# Patient Record
Sex: Male | Born: 1995 | Race: Black or African American | Hispanic: No | Marital: Single | Smoking: Never smoker
Health system: Southern US, Community
[De-identification: ages and names within clinical notes are randomized; demographics above are authoritative.]

## PROBLEM LIST (undated history)

## (undated) DIAGNOSIS — J45909 Unspecified asthma, uncomplicated: Secondary | ICD-10-CM

## (undated) HISTORY — PX: HERNIA REPAIR: SHX51

---

## 2017-08-10 ENCOUNTER — Other Ambulatory Visit: Payer: Self-pay

## 2017-08-10 ENCOUNTER — Emergency Department (HOSPITAL_COMMUNITY)
Admission: EM | Admit: 2017-08-10 | Discharge: 2017-08-10 | Disposition: A | Discharge status: Home / Self Care | Payer: BC Managed Care – PPO | Attending: Emergency Medicine | Admitting: Emergency Medicine

## 2017-08-10 ENCOUNTER — Emergency Department (HOSPITAL_COMMUNITY): Payer: BC Managed Care – PPO

## 2017-08-10 ENCOUNTER — Encounter (HOSPITAL_COMMUNITY): Payer: Self-pay

## 2017-08-10 DIAGNOSIS — Y9389 Activity, other specified: Secondary | ICD-10-CM | POA: Insufficient documentation

## 2017-08-10 DIAGNOSIS — S39012A Strain of muscle, fascia and tendon of lower back, initial encounter: Secondary | ICD-10-CM | POA: Diagnosis not present

## 2017-08-10 DIAGNOSIS — Y999 Unspecified external cause status: Secondary | ICD-10-CM | POA: Diagnosis not present

## 2017-08-10 DIAGNOSIS — J45909 Unspecified asthma, uncomplicated: Secondary | ICD-10-CM | POA: Diagnosis not present

## 2017-08-10 DIAGNOSIS — Y92410 Unspecified street and highway as the place of occurrence of the external cause: Secondary | ICD-10-CM | POA: Insufficient documentation

## 2017-08-10 DIAGNOSIS — S3992XA Unspecified injury of lower back, initial encounter: Secondary | ICD-10-CM | POA: Diagnosis present

## 2017-08-10 HISTORY — DX: Unspecified asthma, uncomplicated: J45.909

## 2017-08-10 MED ORDER — METHOCARBAMOL 500 MG PO TABS: 500.0000 mg | ORAL_TABLET | Freq: Two times a day (BID) | ORAL | 0 refills | Status: AC

## 2017-08-10 MED ORDER — IBUPROFEN 800 MG PO TABS
800.0000 mg | ORAL_TABLET | Freq: Once | ORAL | Status: AC
  Administered 2017-08-10: 800 mg via ORAL
  Filled 2017-08-10: qty 1

## 2017-08-10 MED ORDER — IBUPROFEN 600 MG PO TABS: 600.0000 mg | ORAL_TABLET | Freq: Four times a day (QID) | ORAL | 0 refills | Status: AC | PRN

## 2017-08-10 NOTE — ED Notes (Signed)
Pt verbalizes understanding of d/c instructions. Pt received prescriptions. Pt ambulatory at d/c with all belongings.  

## 2017-08-10 NOTE — ED Notes (Signed)
Patient transported to X-ray 
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
## 2017-08-10 NOTE — ED Notes (Signed)
Patient returned from XRay
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
## 2019-08-03 ENCOUNTER — Other Ambulatory Visit: Payer: Self-pay

## 2019-08-03 DIAGNOSIS — Z20822 Contact with and (suspected) exposure to covid-19: Secondary | ICD-10-CM

## 2019-08-04 LAB — NOVEL CORONAVIRUS, NAA: SARS-CoV-2, NAA: NOT DETECTED

## 2019-08-08 ENCOUNTER — Other Ambulatory Visit: Payer: BC Managed Care – PPO

## 2019-08-31 ENCOUNTER — Other Ambulatory Visit: Payer: Self-pay

## 2019-08-31 DIAGNOSIS — Z20822 Contact with and (suspected) exposure to covid-19: Secondary | ICD-10-CM

## 2019-09-01 LAB — NOVEL CORONAVIRUS, NAA: SARS-CoV-2, NAA: NOT DETECTED

## 2019-12-05 ENCOUNTER — Ambulatory Visit: Payer: BC Managed Care – PPO | Attending: Internal Medicine

## 2019-12-05 DIAGNOSIS — Z23 Encounter for immunization: Secondary | ICD-10-CM

## 2019-12-05 NOTE — Progress Notes (Signed)
   Covid-19 Vaccination Clinic  Name:  Nathan Hatfield    MRN: [id number] DOB: 08/12/1996  12/05/2019  [name] was observed post Covid-19 immunization for 15 minutes without incident. He was provided with Vaccine Information Sheet and instruction to access the V-Safe system.   Mr. Able was instructed to call 911 with any severe reactions post vaccine: Marland Kitchen Difficulty breathing  . Swelling of face and throat  . A fast heartbeat  . A bad rash all over body  . Dizziness and weakness   Immunizations Administered    Name Date Dose VIS Date Route   Pfizer COVID-19 Vaccine 12/05/2019  1:36 PM 0.3 mL 08/02/2019 Intramuscular   Manufacturer: [employer], [employer]   Lot: [id number]   NDC: [address]-2

## 2019-12-30 ENCOUNTER — Ambulatory Visit: Payer: BC Managed Care – PPO | Attending: Internal Medicine

## 2019-12-30 DIAGNOSIS — Z23 Encounter for immunization: Secondary | ICD-10-CM

## 2019-12-30 NOTE — Progress Notes (Signed)
   Covid-19 Vaccination Clinic  Name:  Nathan Hatfield    MRN: [id number] DOB: September 06, 1995  12/30/2019  Nathan Hatfield was observed post Covid-19 immunization for 15 minutes without incident. He was provided with Vaccine Information Sheet and instruction to access the V-Safe system.   Nathan Hatfield was instructed to call 911 with any severe reactions post vaccine: Marland Kitchen Difficulty breathing  . Swelling of face and throat  . A fast heartbeat  . A bad rash all over body  . Dizziness and weakness   Immunizations Administered    Name Date Dose VIS Date Route   Pfizer COVID-19 Vaccine 12/30/2019  9:22 AM 0.3 mL 10/16/2018 Intramuscular   Manufacturer: [employer], [employer]   Lot: [id number]   NDC: [address]-4
